# Patient Record
Sex: Male | Born: 1990 | Race: Black or African American | Hispanic: No | Marital: Single | Smoking: Current every day smoker
Health system: Southern US, Community
[De-identification: ages and names within clinical notes are randomized; demographics above are authoritative.]

---

## 2016-09-01 ENCOUNTER — Emergency Department (HOSPITAL_COMMUNITY): Payer: Medicaid Other

## 2016-09-01 ENCOUNTER — Encounter (HOSPITAL_COMMUNITY): Payer: Self-pay | Admitting: Emergency Medicine

## 2016-09-01 ENCOUNTER — Emergency Department (HOSPITAL_COMMUNITY)
Admission: EM | Admit: 2016-09-01 | Discharge: 2016-09-01 | Disposition: A | Discharge status: Home / Self Care | Payer: Medicaid Other | Attending: Emergency Medicine | Admitting: Emergency Medicine

## 2016-09-01 DIAGNOSIS — S6991XA Unspecified injury of right wrist, hand and finger(s), initial encounter: Secondary | ICD-10-CM | POA: Diagnosis present

## 2016-09-01 DIAGNOSIS — Y999 Unspecified external cause status: Secondary | ICD-10-CM | POA: Diagnosis not present

## 2016-09-01 DIAGNOSIS — F17210 Nicotine dependence, cigarettes, uncomplicated: Secondary | ICD-10-CM | POA: Insufficient documentation

## 2016-09-01 DIAGNOSIS — S62634A Displaced fracture of distal phalanx of right ring finger, initial encounter for closed fracture: Secondary | ICD-10-CM | POA: Insufficient documentation

## 2016-09-01 DIAGNOSIS — Z202 Contact with and (suspected) exposure to infections with a predominantly sexual mode of transmission: Secondary | ICD-10-CM | POA: Diagnosis not present

## 2016-09-01 DIAGNOSIS — Y939 Activity, unspecified: Secondary | ICD-10-CM | POA: Insufficient documentation

## 2016-09-01 DIAGNOSIS — Y929 Unspecified place or not applicable: Secondary | ICD-10-CM | POA: Insufficient documentation

## 2016-09-01 LAB — URINALYSIS, ROUTINE W REFLEX MICROSCOPIC
Bilirubin Urine: NEGATIVE
GLUCOSE, UA: NEGATIVE mg/dL
Hgb urine dipstick: NEGATIVE
KETONES UR: NEGATIVE mg/dL
LEUKOCYTES UA: NEGATIVE
Nitrite: NEGATIVE
PH: 7 (ref 5.0–8.0)
Protein, ur: NEGATIVE mg/dL
Specific Gravity, Urine: 1.017 (ref 1.005–1.030)

## 2016-09-01 MED ORDER — AZITHROMYCIN 250 MG PO TABS
1000.0000 mg | ORAL_TABLET | Freq: Once | ORAL | Status: AC
  Administered 2016-09-01: 1000 mg via ORAL
  Filled 2016-09-01: qty 4

## 2016-09-01 MED ORDER — CEFTRIAXONE SODIUM 250 MG IJ SOLR
250.0000 mg | Freq: Once | INTRAMUSCULAR | Status: AC
  Administered 2016-09-01: 250 mg via INTRAMUSCULAR
  Filled 2016-09-01: qty 250
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
## 2016-09-01 NOTE — Discharge Instructions (Signed)
Continue wearing your finger splint daily for the next week until you follow up with the hand surgery clinic listed below. He may continue to rest, elevate and ice her finger for 15-20 minutes 3-4 times daily to help with pain and swelling. He may also take 600 mg ibuprofen every 6 hours as needed for pain relief. Call the hand surgery clinic listed below to schedule a follow-up appointment for reevaluation in the next week.  1. Medications: usual home medications 2. Treatment: rest, drink plenty of fluids, use a condom with every sexual encounter 3. Follow Up: Please followup with your primary doctor in 3 days for discussion of your diagnoses and further evaluation after today's visit; if you do not have a primary care doctor use the resource guide provided to find one; Please return to the ER for worsening symptoms, high fevers or persistent vomiting.  You have been tested for HIV, syphilis, chlamydia and gonorrhea.  These results will be available in approximately 3 days.  Please inform all sexual partners if you test positive for any of these diseases.
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
## 2016-09-01 NOTE — ED Notes (Signed)
Patient is alert and oriented x3.  He was given DC instructions and follow up visit instructions.  Patient gave verbal understanding.  He was DC ambulatory under his own power to home.  V/S stable.  He was not showing any signs of distress on DC 

## 2016-09-01 NOTE — ED Triage Notes (Signed)
Pt comes with complaints of exposure to an STD. Denies discharge. Endorses burning upon urination.

## 2016-09-02 LAB — RPR: RPR: NONREACTIVE

## 2016-09-02 LAB — HIV ANTIBODY (ROUTINE TESTING W REFLEX): HIV SCREEN 4TH GENERATION: NONREACTIVE

## 2016-09-02 LAB — GC/CHLAMYDIA PROBE AMP (~~LOC~~) NOT AT ARMC
Chlamydia: NEGATIVE
Neisseria Gonorrhea: NEGATIVE

## 2016-10-15 ENCOUNTER — Encounter (HOSPITAL_COMMUNITY): Payer: Self-pay | Admitting: Emergency Medicine

## 2016-10-15 ENCOUNTER — Emergency Department (HOSPITAL_COMMUNITY)
Admission: EM | Admit: 2016-10-15 | Discharge: 2016-10-15 | Disposition: A | Discharge status: Home / Self Care | Payer: Medicaid Other | Attending: Emergency Medicine | Admitting: Emergency Medicine

## 2016-10-15 DIAGNOSIS — F17210 Nicotine dependence, cigarettes, uncomplicated: Secondary | ICD-10-CM | POA: Insufficient documentation

## 2016-10-15 DIAGNOSIS — W503XXA Accidental bite by another person, initial encounter: Secondary | ICD-10-CM | POA: Diagnosis not present

## 2016-10-15 DIAGNOSIS — S6991XA Unspecified injury of right wrist, hand and finger(s), initial encounter: Secondary | ICD-10-CM | POA: Diagnosis present

## 2016-10-15 DIAGNOSIS — Y9389 Activity, other specified: Secondary | ICD-10-CM | POA: Insufficient documentation

## 2016-10-15 DIAGNOSIS — Z23 Encounter for immunization: Secondary | ICD-10-CM | POA: Diagnosis not present

## 2016-10-15 DIAGNOSIS — Y92019 Unspecified place in single-family (private) house as the place of occurrence of the external cause: Secondary | ICD-10-CM | POA: Insufficient documentation

## 2016-10-15 DIAGNOSIS — Y999 Unspecified external cause status: Secondary | ICD-10-CM | POA: Insufficient documentation

## 2016-10-15 DIAGNOSIS — Z1159 Encounter for screening for other viral diseases: Secondary | ICD-10-CM | POA: Diagnosis not present

## 2016-10-15 DIAGNOSIS — S61411A Laceration without foreign body of right hand, initial encounter: Secondary | ICD-10-CM | POA: Diagnosis not present

## 2016-10-15 LAB — RAPID HIV SCREEN (HIV 1/2 AB+AG)
HIV 1/2 Antibodies: NONREACTIVE
HIV-1 P24 Antigen - HIV24: NONREACTIVE

## 2016-10-15 MED ORDER — TETANUS-DIPHTH-ACELL PERTUSSIS 5-2.5-18.5 LF-MCG/0.5 IM SUSP
0.5000 mL | Freq: Once | INTRAMUSCULAR | Status: AC
  Administered 2016-10-15: 0.5 mL via INTRAMUSCULAR
  Filled 2016-10-15: qty 0.5

## 2016-10-15 MED ORDER — AMOXICILLIN-POT CLAVULANATE 875-125 MG PO TABS
1.0000 | ORAL_TABLET | Freq: Once | ORAL | Status: AC
  Administered 2016-10-15: 1 via ORAL
  Filled 2016-10-15: qty 1

## 2016-10-15 MED ORDER — AMOXICILLIN-POT CLAVULANATE 875-125 MG PO TABS: 1.0000 | ORAL_TABLET | Freq: Two times a day (BID) | ORAL | 0 refills | Status: AC
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
## 2016-10-15 NOTE — Discharge Instructions (Signed)
Please take Augmentin twice a day for the next 5 days. If you develop redness, swelling, or warmth around the wound, or fever or chills, please return to the emergency department for reevaluation. Please keep the area clean with warm soap and water. Please apply a topical antibiotic like Neosporin or bacitracin over the wound and keep the wound covered with a clean gauze dressing. Please change the dressing daily after cleaning the wound and applying antibiotic ointment to the skin. If the wound does not start to improve after 3-4 days, please return to the emergency department or call the number on her discharge paper to get established with a primary care provider for a wound recheck. Your tetanus vaccination has been updated today.

## 2016-10-15 NOTE — ED Triage Notes (Signed)
Rapid HIV result is Negative per lab. PA advised
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
## 2016-10-15 NOTE — ED Notes (Signed)
Bed: [facility] Expected date:  Expected time:  Means of arrival:  Comments: 
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
## 2016-10-15 NOTE — ED Triage Notes (Signed)
PA met with patient. Pt advised  To call an follow up to get additional lab results

## 2016-10-15 NOTE — ED Triage Notes (Addendum)
PHONE # [phone]  All discharge and follow up instructions needed to be repeated x 3. Pt repeated back all instructions. ,Proper phone number restated

## 2016-10-16 LAB — HEPATITIS PANEL, ACUTE
HCV Ab: <0.1 s/co ratio (ref 0.0–0.9)
Hep A IgM: NEGATIVE
Hep B C IgM: NEGATIVE
Hepatitis B Surface Ag: NEGATIVE

## 2016-10-16 LAB — HEPATITIS B SURFACE ANTIGEN: HEP B S AG: NEGATIVE

## 2018-02-08 IMAGING — DX DG FINGER RING 2+V*R*
3 series · 3 of 3 positions shown · non-contrast
Comparison: No recent prior .

CLINICAL DATA: Injury.

EXAM:
RIGHT RING FINGER 2+V

[finger ap]
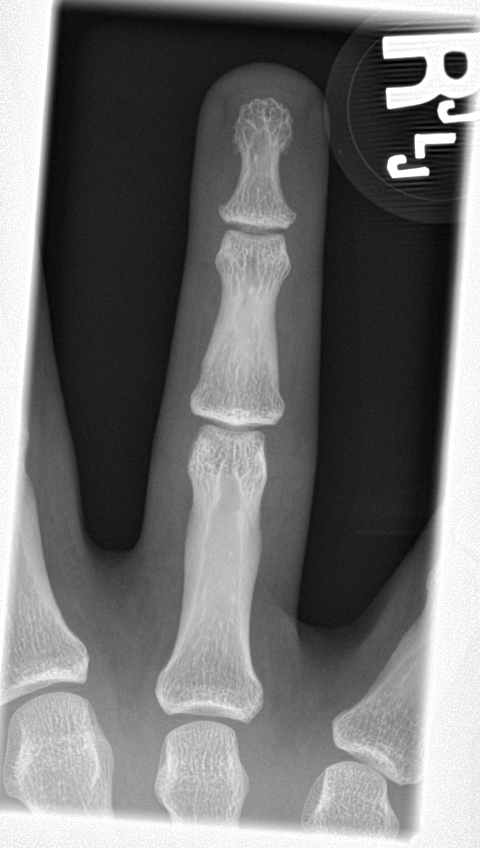

[finger obl]
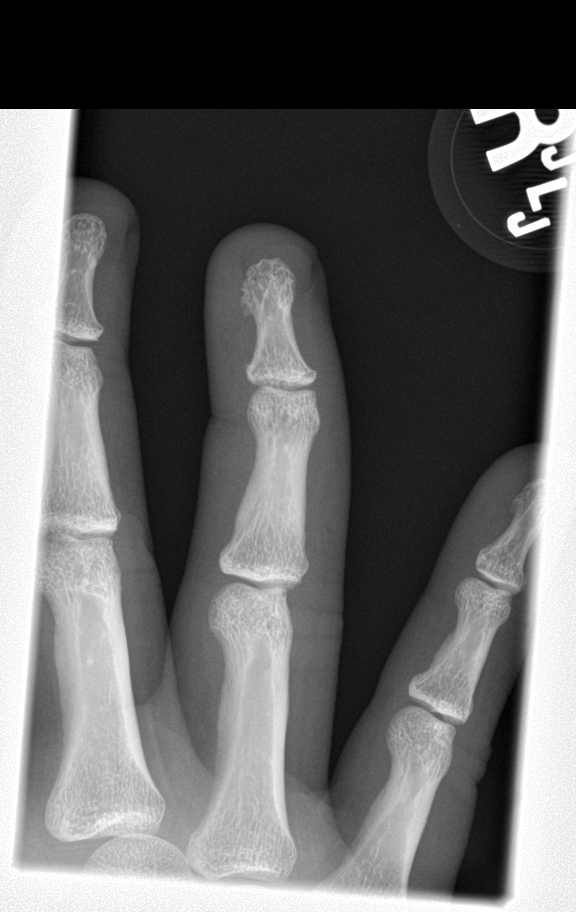

[finger lat]
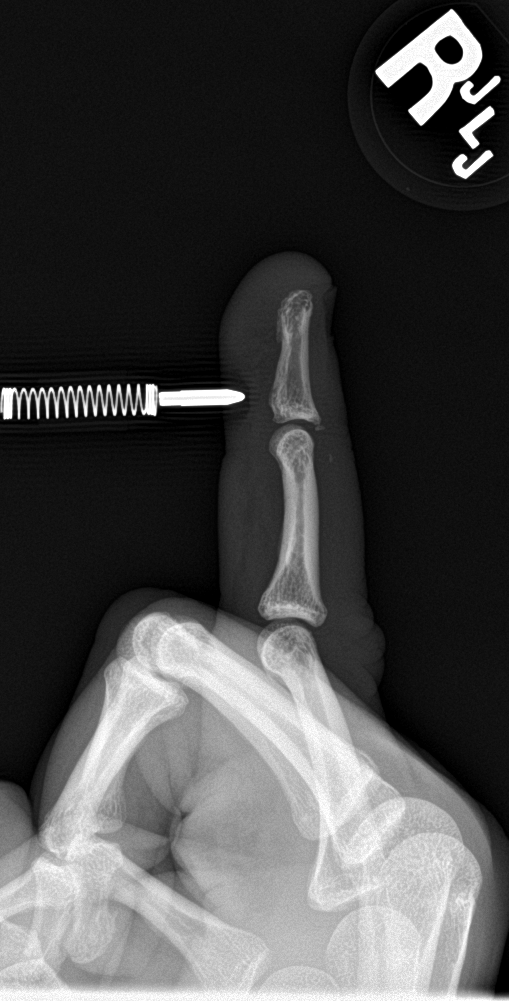

[3 of 3 positions shown; findings below may reference images not displayed]

FINDINGS: Tiny bony density noted along the posterior base of the distal
phalanx of the right fourth digit. This may represent a tiny
fracture fragment, age undetermined. Tiny bony density noted along
the soft tissues along the dorsum of the hand adjacent to the distal
aspect of the middle phalanx of the right fourth digit. This could
represent displaced fracture fragment or foreign body .
IMPRESSION: 1. Tiny fracture fragment the base of the distal phalanx of the
right fourth digit. This may be old.

2. Tiny displaced fracture fragment versus tiny foreign body noted
along the dorsal soft tissues adjacent to the distal aspect of the
middle phalanx of the right fourth digit.
